# Patient Record
Sex: Female | Born: 1960 | Hispanic: Yes | Marital: Married
Health system: Southern US, Community
[De-identification: ages and names within clinical notes are randomized; demographics above are authoritative.]

---

## 2004-08-20 ENCOUNTER — Ambulatory Visit: Payer: Self-pay

## 2005-05-25 ENCOUNTER — Ambulatory Visit: Payer: Self-pay | Admitting: Family Medicine

## 2019-09-26 ENCOUNTER — Ambulatory Visit: Payer: Self-pay | Attending: Oncology | Admitting: *Deleted

## 2019-09-26 ENCOUNTER — Ambulatory Visit
Admission: RE | Admit: 2019-09-26 | Discharge: 2019-09-26 | Disposition: A | Discharge status: Home / Self Care | Payer: Self-pay | Source: Ambulatory Visit | Attending: Oncology | Admitting: Oncology

## 2019-09-26 ENCOUNTER — Other Ambulatory Visit: Payer: Self-pay

## 2019-09-26 ENCOUNTER — Encounter: Payer: Self-pay | Admitting: *Deleted

## 2019-09-26 VITALS — BP 104/70 | HR 93 | Temp 97.8°F | Resp 20 | Ht 67.0 in | Wt 212.0 lb

## 2019-09-26 DIAGNOSIS — Z0000 Encounter for general adult medical examination without abnormal findings: Secondary | ICD-10-CM | POA: Insufficient documentation

## 2019-09-26 NOTE — Progress Notes (Signed)
  Subjective:     Patient ID: Lindsay Cameron, female   DOB: [birth year], 59 y.o.   MRN: [id number]  HPI  [facility] Medical History Record - 09/26/19 1013      Breast History   Screening cycle Rescreen    CBE Date 09/26/19    Provider ([facility]) [facility]    Last Mammogram Annual    Last Mammogram Date 08/20/04    Recent Breast Symptoms None      Breast Cancer History   Breast Cancer History Patient and mother/daughter/sister have had breast cancer    Comments/Details mother      Previous History of Breast Problems   Breast Surgery or Biopsy None    Breast Implants N/A    BSE Done Never      Gynecological/Obstetrical History   LMP --   15 yeasr ago   Is there any chance that the client could be pregnant?  No    Age at menopause 59    PAP smear history Annually    Date of last PAP  10/21/19    Breast fed children No    DES Exposure No    Cervical, Uterine or Ovarian cancer No    Family history of Cervial, Uterine or Ovarian cancer No    Hysterectomy Yes    Cervix removed Yes    Ovaries removed Yes    Laser/Cryosurgery No    Current method of birth control None    Current method of Estrogen/Hormone replacement None    Smoking history None           Review of Systems     Objective:   Physical Exam Chest:     Breasts:        Right: No swelling, bleeding, inverted nipple, mass, nipple discharge, skin change or tenderness.        Left: No swelling, bleeding, inverted nipple, mass, nipple discharge, skin change or tenderness.  Lymphadenopathy:     Upper Body:     Right upper body: No supraclavicular or axillary adenopathy.     Left upper body: No supraclavicular or axillary adenopathy.        Assessment:     59 year old Hispanic female presents to [facility] for clinical breast exam and mammogram only.   She is accompanied by her granddaughter.  Loyda, the interpreter present during the interview and exam.  Clinical breast exam unremarkable.  Taught self breast awareness.   Last pap was about 2 weeks ago per patient and was normal.  States she was told she did not need any more paps since she had a hysterectomy.  Reviewed guidelines for pap smears.  Patient has been screened for eligibility.  She does not have any insurance, Medicare or Medicaid.  She also meets financial eligibility.   Risk Assessment    Risk Scores      09/26/2019   Last edited by: Neita Garnet, CMA   5-year risk:    Lifetime risk:             Plan:     Screening mammogram ordered.  Will follow up per [facility] protocol.

## 2019-09-26 NOTE — Patient Instructions (Signed)
Gave patient hand-out, Women Staying Healthy, Active and Well from [facility], with education on breast health, pap smears, heart and colon health. 

## 2019-09-28 ENCOUNTER — Encounter: Payer: Self-pay | Admitting: *Deleted

## 2019-09-28 NOTE — Progress Notes (Signed)
Letter mailed from the [facility] to inform patient of her normal mammogram results.  Patient is to follow-up with annual screening in one year. 

## 2021-08-16 IMAGING — MG DIGITAL SCREENING BILAT W/ TOMO W/ CAD
8 series · 8 of 24 positions shown · non-contrast
Comparison: Previous exam(s).

CLINICAL DATA: Screening.

EXAM:
DIGITAL SCREENING BILATERAL MAMMOGRAM WITH TOMO AND CAD

[R MLO synth-2D]
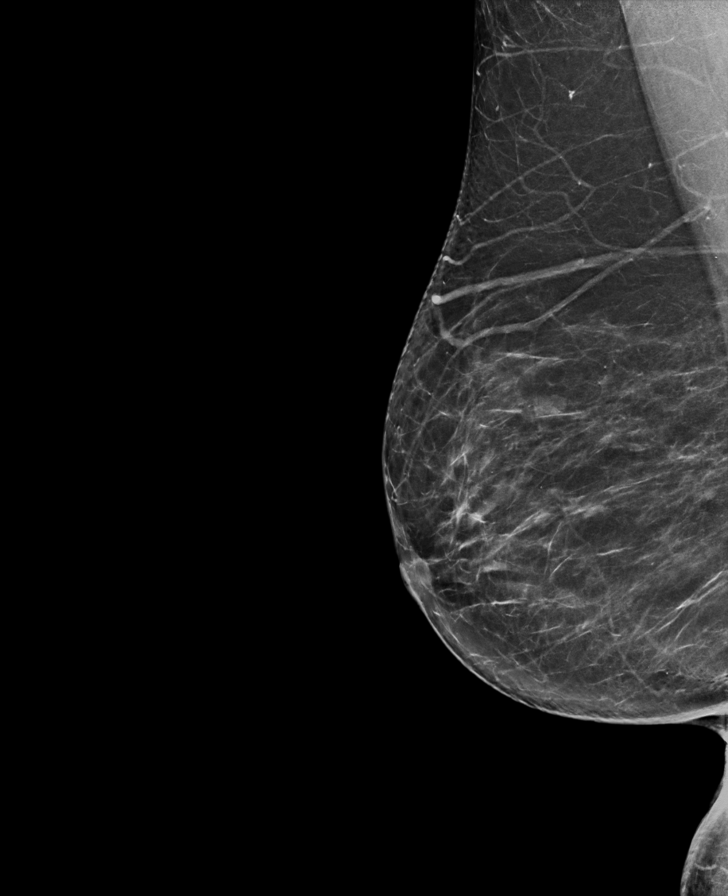

[R CC synth-2D]
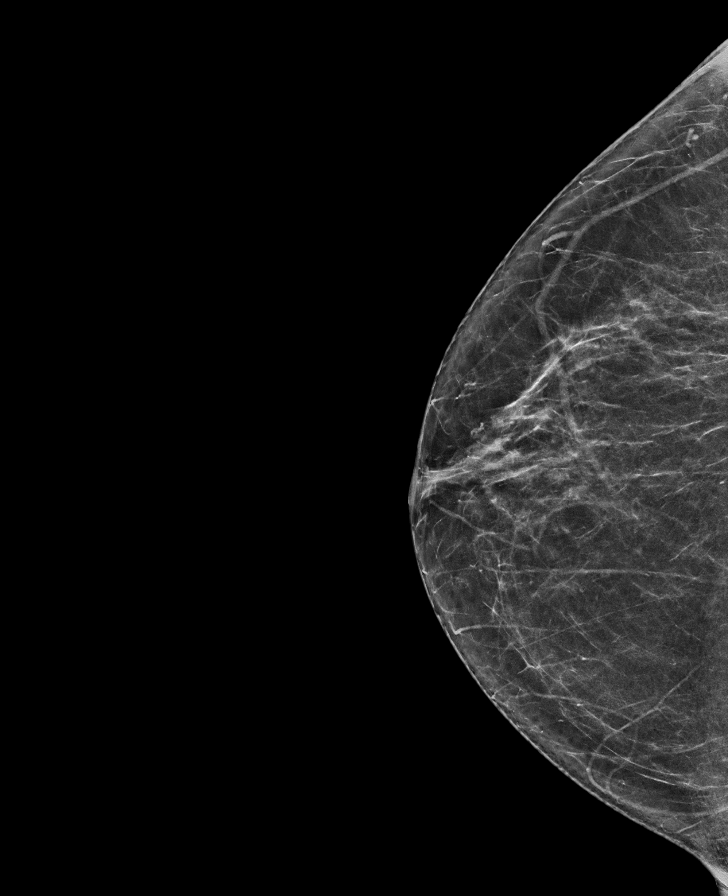

[L MLO synth-2D]
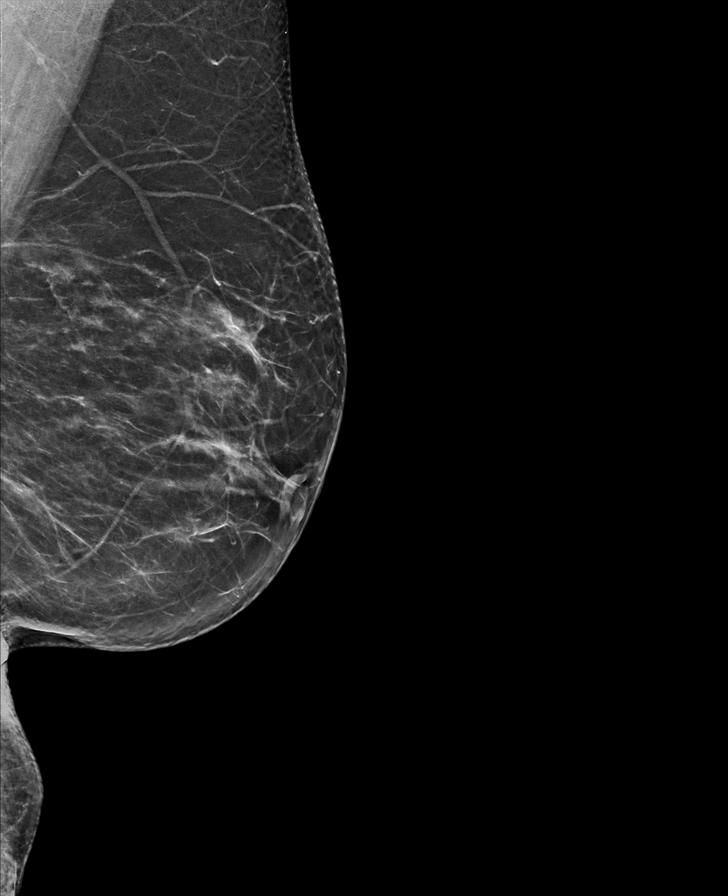

[L CC synth-2D]
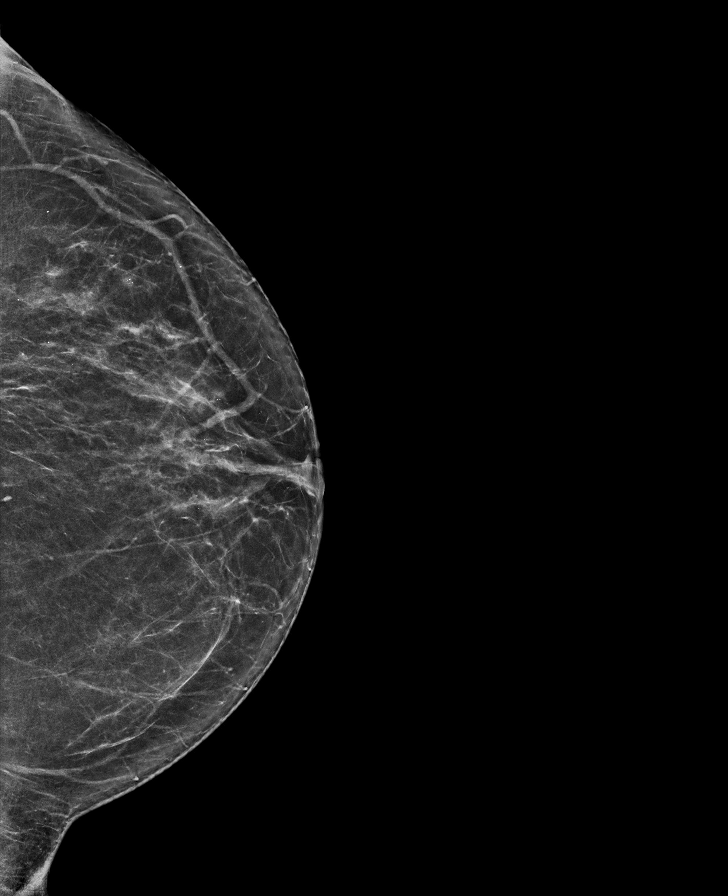

[R CC tomo · tomo slice 35/70.0]
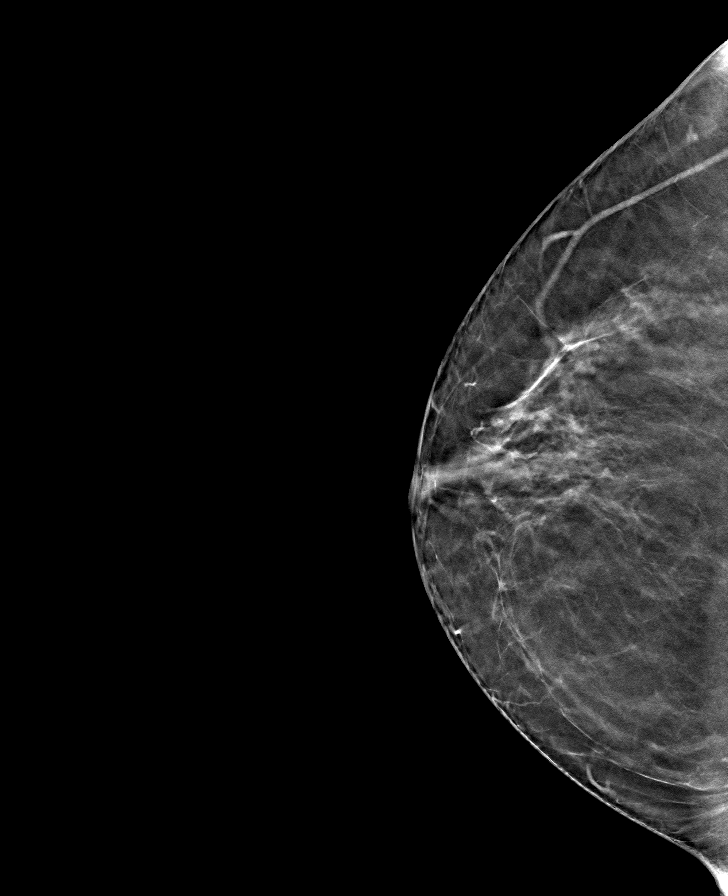

[L MLO tomo · tomo slice 37/74.0]
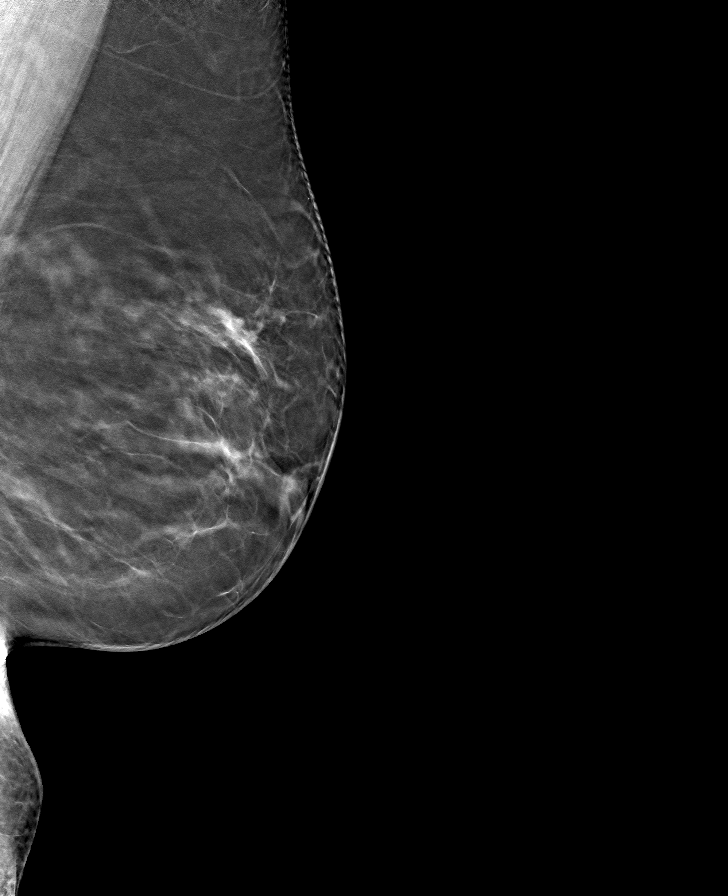

[R MLO tomo · tomo slice 39/76.0]
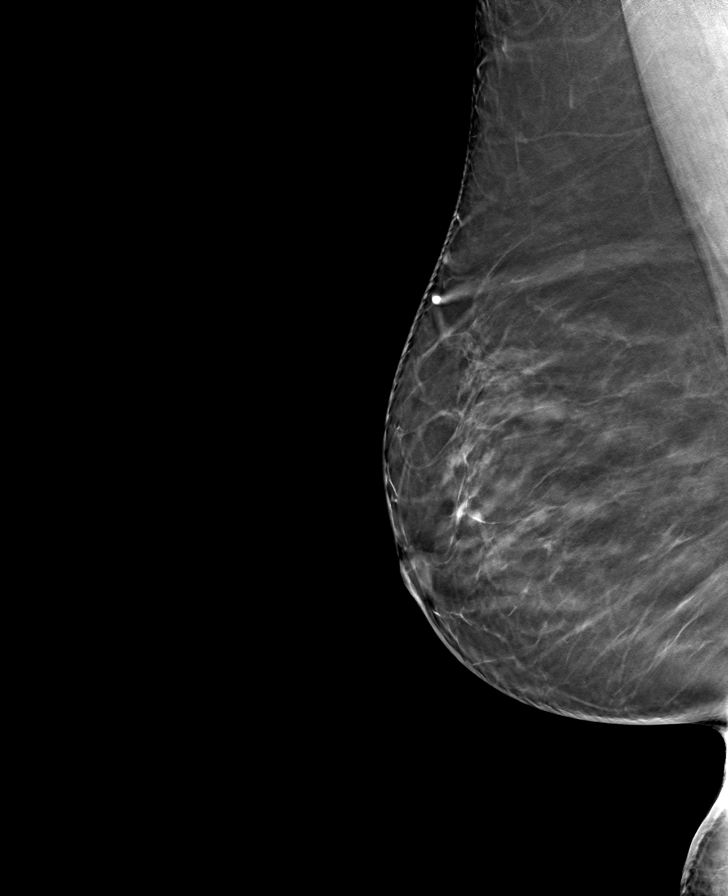

[L CC tomo · tomo slice 35/70.0]
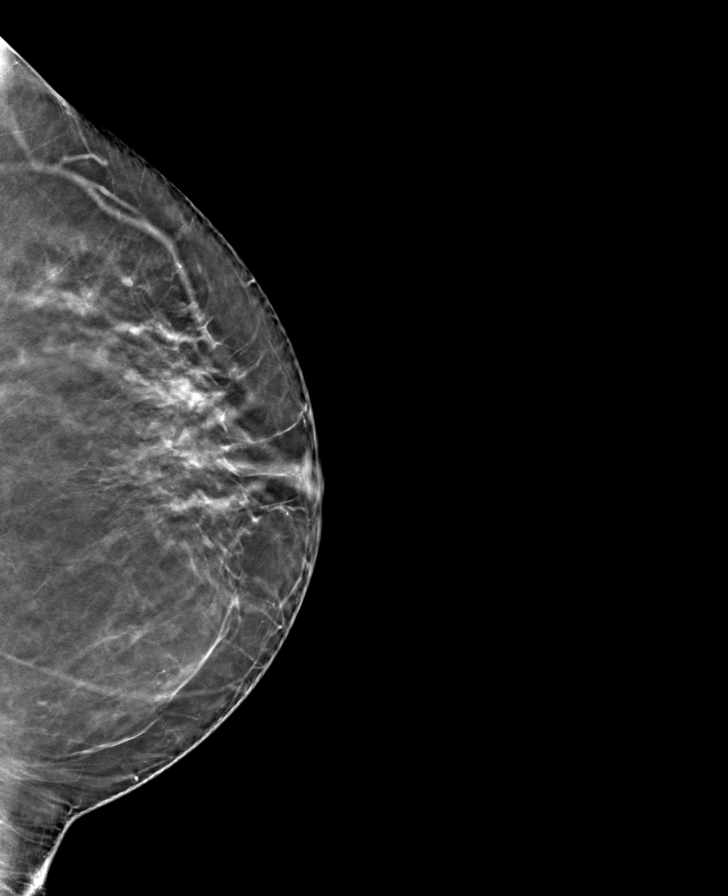

[8 of 24 positions shown; findings below may reference images not displayed]

ACR Breast Density Category b: There are scattered areas of
fibroglandular density.
FINDINGS: There are no findings suspicious for malignancy. Images were
processed with CAD.
IMPRESSION: No mammographic evidence of malignancy. A result letter of this
screening mammogram will be mailed directly to the patient.

RECOMMENDATION:
Screening mammogram in one year. (Code:[id number])

BI-RADS CATEGORY  1: Negative.
